# Patient Record
Sex: Female | Born: 1980 | Race: Black or African American | Hispanic: No | Marital: Single | Smoking: Never smoker
Health system: Southern US, Community
[De-identification: ages and names within clinical notes are randomized; demographics above are authoritative.]

## PROBLEM LIST (undated history)

## (undated) ENCOUNTER — Ambulatory Visit (HOSPITAL_COMMUNITY): Admission: EM | Payer: Medicaid Other

## (undated) DIAGNOSIS — I10 Essential (primary) hypertension: Secondary | ICD-10-CM

---

## 2017-05-30 ENCOUNTER — Emergency Department (HOSPITAL_COMMUNITY)
Admission: EM | Admit: 2017-05-30 | Discharge: 2017-05-30 | Disposition: A | Discharge status: Home / Self Care | Payer: No Typology Code available for payment source | Attending: Emergency Medicine | Admitting: Emergency Medicine

## 2017-05-30 ENCOUNTER — Other Ambulatory Visit: Payer: Self-pay

## 2017-05-30 ENCOUNTER — Encounter (HOSPITAL_COMMUNITY): Payer: Self-pay

## 2017-05-30 ENCOUNTER — Emergency Department (HOSPITAL_COMMUNITY): Payer: No Typology Code available for payment source

## 2017-05-30 DIAGNOSIS — S20212A Contusion of left front wall of thorax, initial encounter: Secondary | ICD-10-CM | POA: Diagnosis not present

## 2017-05-30 DIAGNOSIS — I10 Essential (primary) hypertension: Secondary | ICD-10-CM | POA: Diagnosis not present

## 2017-05-30 DIAGNOSIS — Y939 Activity, unspecified: Secondary | ICD-10-CM | POA: Insufficient documentation

## 2017-05-30 DIAGNOSIS — Y929 Unspecified place or not applicable: Secondary | ICD-10-CM | POA: Diagnosis not present

## 2017-05-30 DIAGNOSIS — S299XXA Unspecified injury of thorax, initial encounter: Secondary | ICD-10-CM | POA: Diagnosis present

## 2017-05-30 DIAGNOSIS — Y999 Unspecified external cause status: Secondary | ICD-10-CM | POA: Insufficient documentation

## 2017-05-30 HISTORY — DX: Essential (primary) hypertension: I10

## 2017-05-30 MED ORDER — KETOROLAC TROMETHAMINE 30 MG/ML IJ SOLN
30.0000 mg | Freq: Once | INTRAMUSCULAR | Status: AC
  Administered 2017-05-30: 30 mg via INTRAMUSCULAR
  Filled 2017-05-30: qty 1
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
## 2017-05-30 NOTE — Discharge Instructions (Signed)
Please return for any problem.  °

## 2017-05-30 NOTE — ED Triage Notes (Signed)
Correction: Pt reports pain to left side of neck and abdomen.
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
## 2019-11-04 ENCOUNTER — Ambulatory Visit: Payer: Medicaid Other | Attending: Internal Medicine

## 2019-11-04 DIAGNOSIS — Z23 Encounter for immunization: Secondary | ICD-10-CM

## 2019-11-04 NOTE — Progress Notes (Signed)
   Covid-19 Vaccination Clinic  Name:  Lisa Combs    MRN: [id number] DOB: [birth year]  11/04/2019  [name] was observed post Covid-19 immunization for 15 minutes without incident. She was provided with Vaccine Information Sheet and instruction to access the V-Safe system.   [name] was instructed to call 911 with any severe reactions post vaccine: Marland Kitchen Difficulty breathing  . Swelling of face and throat  . A fast heartbeat  . A bad rash all over body  . Dizziness and weakness   Immunizations Administered    Name Date Dose VIS Date Route   Pfizer COVID-19 Vaccine 11/04/2019  1:31 PM 0.3 mL 07/01/2019 Intramuscular   Manufacturer: [employer], [employer]   Lot: [id number]   NDC: [address]-9

## 2019-11-28 ENCOUNTER — Ambulatory Visit: Payer: Medicaid Other | Attending: Internal Medicine

## 2019-11-28 DIAGNOSIS — Z23 Encounter for immunization: Secondary | ICD-10-CM

## 2019-11-28 NOTE — Progress Notes (Signed)
   Covid-19 Vaccination Clinic  Name:  Lisa Combs    MRN: [id number] DOB: 1980-11-08  11/28/2019  Lisa Combs was observed post Covid-19 immunization for 15 minutes without incident. She was provided with Vaccine Information Sheet and instruction to access the V-Safe system.   Lisa Combs was instructed to call 911 with any severe reactions post vaccine: Marland Kitchen Difficulty breathing  . Swelling of face and throat  . A fast heartbeat  . A bad rash all over body  . Dizziness and weakness   Immunizations Administered    Name Date Dose VIS Date Route   Pfizer COVID-19 Vaccine 11/28/2019 12:20 PM 0.3 mL 09/14/2018 Intramuscular   Manufacturer: [employer], [employer]   Lot: [id number]   NDC: [address]-3

## 2023-08-19 ENCOUNTER — Encounter (HOSPITAL_BASED_OUTPATIENT_CLINIC_OR_DEPARTMENT_OTHER): Payer: Self-pay | Admitting: Emergency Medicine

## 2023-08-19 ENCOUNTER — Emergency Department (HOSPITAL_BASED_OUTPATIENT_CLINIC_OR_DEPARTMENT_OTHER)
Admission: EM | Admit: 2023-08-19 | Discharge: 2023-08-19 | Disposition: A | Discharge status: Home / Self Care | Payer: 59 | Attending: Emergency Medicine | Admitting: Emergency Medicine

## 2023-08-19 ENCOUNTER — Other Ambulatory Visit: Payer: Self-pay

## 2023-08-19 DIAGNOSIS — U071 COVID-19: Secondary | ICD-10-CM | POA: Diagnosis not present

## 2023-08-19 DIAGNOSIS — I10 Essential (primary) hypertension: Secondary | ICD-10-CM | POA: Diagnosis not present

## 2023-08-19 DIAGNOSIS — Z79899 Other long term (current) drug therapy: Secondary | ICD-10-CM | POA: Diagnosis not present

## 2023-08-19 DIAGNOSIS — R509 Fever, unspecified: Secondary | ICD-10-CM | POA: Diagnosis present

## 2023-08-19 LAB — RESP PANEL BY RT-PCR (RSV, FLU A&B, COVID)  RVPGX2
Influenza A by PCR: NEGATIVE
Influenza B by PCR: NEGATIVE
Resp Syncytial Virus by PCR: NEGATIVE
SARS Coronavirus 2 by RT PCR: POSITIVE — AB

## 2023-08-19 MED ORDER — AMLODIPINE BESYLATE 5 MG PO TABS: 5.0000 mg | ORAL_TABLET | Freq: Every day | ORAL | 2 refills | Status: DC

## 2023-08-19 NOTE — ED Triage Notes (Signed)
C/o cough, congestion, chills, and nausea/diarrhea  x 3 days, Denies SHOB.

## 2023-08-19 NOTE — Discharge Instructions (Signed)
You were seen in the ER today for concerns of a fever. You tested positive for COVID-19. This is a viral infection that is contagious. Given you are more than 3 days into symptoms starting, I would recommend against starting antiviral medication for this at this time. You should focus treating your symptoms at home as best as possible.  I have sent in refills of your blood pressure medication to your pharmacy while you are working to get set up with a primary care provider. You will need to be seen by a primary care provider for future refills.

## 2023-08-19 NOTE — ED Provider Notes (Signed)
[place] [facility] AT [facility] Provider Note   CSN: [id number] Arrival date & time: 08/19/23  1638     History  Chief Complaint  Patient presents with   Fever    Lisa Combs is a 43 y.o. female.  Patient past history significant for hypertension presents to the emergency department concerns of a fever, cough and congestion.  States this been ongoing for the last 3 to 4 days.  Denies any shortness of breath or chest pain.  Also endorsing some nausea and diarrhea.  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   Fever      Home Medications Prior to Admission medications   Medication Sig Start Date End Date Taking? Authorizing Provider  amLODipine (NORVASC) 5 MG tablet Take 1 tablet (5 mg total) by mouth daily. 08/19/23 09/18/23 Yes Smitty Knudsen, PA-C      Allergies    Patient has no known allergies.    Review of Systems   Review of Systems  Constitutional:  Positive for fever.  All other systems reviewed and are negative.   Physical Exam Updated Vital Signs BP (!) 155/110 (BP Location: Right Arm)   Pulse 87   Temp 98.9 F (37.2 C) (Oral)   Resp 16   SpO2 99%  Physical Exam Vitals and nursing note reviewed.  Constitutional:      General: She is not in acute distress.    Appearance: She is well-developed.  HENT:     Head: Normocephalic and atraumatic.  Eyes:     Conjunctiva/sclera: Conjunctivae normal.  Cardiovascular:     Rate and Rhythm: Normal rate and regular rhythm.     Heart sounds: No murmur heard. Pulmonary:     Effort: Pulmonary effort is normal. No respiratory distress.     Breath sounds: Normal breath sounds. No wheezing or rales.  Abdominal:     Palpations: Abdomen is soft.     Tenderness: There is no abdominal tenderness.  Musculoskeletal:        General: No  swelling.     Cervical back: Neck supple.  Skin:    General: Skin is warm and dry.     Capillary Refill: Capillary refill takes less than 2 seconds.  Neurological:     Mental Status: She is alert.  Psychiatric:        Mood and Affect: Mood normal.     ED Results / Procedures / Treatments   Labs (all labs ordered are listed, but only abnormal results are displayed) Labs Reviewed  RESP PANEL BY RT-PCR (RSV, FLU A&B, COVID)  [id number] - Abnormal; Notable for the following components:      Result Value   SARS Coronavirus 2 by RT PCR POSITIVE (*)    All other components within normal limits    EKG None  Radiology No results found.  Procedures Procedures    Medications Ordered in ED Medications - No data to display  ED Course/ Medical Decision Making/ A&P                                 Medical Decision Making Risk Prescription drug management.   This patient presents to the ED for concern of fever. Differential diagnosis includes COVID-19, influenza, pneumonia, viral URI  Lab Tests:  I Ordered, and personally interpreted labs.  The pertinent results include: Respiratory panel positive for COVID-19   Problem List / ED Course:  Patient with past history significant for hypertension presents the emergency department with concerns of a fever.  No sick contacts as far as patient is aware but she does report that she works in [employer], [employer] and there are typically individuals who are sick fairly frequently.  She currently denies any chest pain or shortness of breath.  No other acute or focal concerns.  States that she has been try to take some over-the-counter medications for her symptoms as needed.  Tolerating oral intake without any notable vomiting. Physical exam is unremarkable with no abnormal lung sounds.  No development of any heart murmur.  Vitals are unremarkable with exception of mild hypertension.  Patient reports that she takes amlodipine for this and is needing a  refill as she has not been able to set up with a primary care provider here locally to continue with her medications.  She states that she is try to reach out to a virtual service who only refills her medications 2 weeks at a time.  Given that patient is able to improve her home medication, I have sent in a refill of her medication and advised her to set up with a primary care provider for further refills.  No indication to suggest that patient is currently experiencing hypertensive emergency as she is not having chest pain, shortness of breath, vision changes, or notable leg swelling. At this time, I believe that patient is stable for outpatient follow-up and discharge home given that she is hemodynamically stable.  Encouraged symptomatic treatment for COVID-19 infection.  Also discussed return precautions and precautions to take at home with vulnerable individuals.  Patient verbalized understanding and agreed with this current plan.  Discharged home in stable condition.   Social Determinants of Health:  No current primary care provider  Final Clinical Impression(s) / ED Diagnoses Final diagnoses:  COVID-19  Hypertension, unspecified type    Rx / DC Orders ED Discharge Orders          Ordered    amLODipine (NORVASC) 5 MG tablet  Daily        08/19/23 1825              Smitty Knudsen, PA-C 08/19/23 1833    Royanne Foots, DO 08/24/23 [phone]

## 2024-01-02 ENCOUNTER — Telehealth: Admitting: Family

## 2024-01-02 DIAGNOSIS — R399 Unspecified symptoms and signs involving the genitourinary system: Secondary | ICD-10-CM | POA: Diagnosis not present

## 2024-01-02 DIAGNOSIS — R5383 Other fatigue: Secondary | ICD-10-CM

## 2024-01-02 NOTE — Patient Instructions (Signed)

## 2024-01-02 NOTE — Progress Notes (Signed)
 Virtual Visit Consent   Lisa Combs, you are scheduled for a virtual visit with a [facility] provider today. Just as with appointments in the office, your consent must be obtained to participate. Your consent will be active for this visit and any virtual visit you may have with one of our providers in the next 365 days. If you have a MyChart account, a copy of this consent can be sent to you electronically.  As this is a virtual visit, video technology does not allow for your provider to perform a traditional examination. This may limit your provider's ability to fully assess your condition. If your provider identifies any concerns that need to be evaluated in person or the need to arrange testing (such as labs, EKG, etc.), we will make arrangements to do so. Although advances in technology are sophisticated, we cannot ensure that it will always work on either your end or our end. If the connection with a video visit is poor, the visit may have to be switched to a telephone visit. With either a video or telephone visit, we are not always able to ensure that we have a secure connection.  By engaging in this virtual visit, you consent to the provision of healthcare and authorize for your insurance to be billed (if applicable) for the services provided during this visit. Depending on your insurance coverage, you may receive a charge related to this service.  I need to obtain your verbal consent now. Are you willing to proceed with your visit today? Jaleia Hanke has provided verbal consent on 01/02/2024 for a virtual visit (video or telephone). Tommas Fragmin, FNP  Date: 01/02/2024 7:19 PM   Virtual Visit via Video Note   I, Tommas Fragmin, connected with  Lisa Combs  ([id number], 1981-05-30) on 01/02/24 at  7:15 PM EDT by a video-enabled telemedicine application and verified that I am speaking with the correct person using two identifiers.  Location: Patient: Virtual Visit Location  Patient: Home Provider: Virtual Visit Location Provider: Home Office   I discussed the limitations of evaluation and management by telemedicine and the availability of in person appointments. The patient expressed understanding and agreed to proceed.    History of Present Illness: Lisa Combs is a 43 y.o. who identifies as a female who was assigned female at birth, and is being seen today for UTI symptoms that started two days ago. She was seen at Urgent Care and started on macrobid. She reports she continues to have dysuria, urinary frequency, and abdominal pain. Reports she is feeling better since starting the macrobid, but is having fatigue.   HPI: Urinary Frequency  This is a new problem. The current episode started in the past 7 days. The problem occurs intermittently. The quality of the pain is described as burning. The pain is at a severity of 7/10. The pain is mild. Associated symptoms include frequency, hesitancy, nausea and urgency. Pertinent negatives include no flank pain, hematuria or vomiting. She has tried antibiotics and increased fluids for the symptoms. The treatment provided mild relief.    Problems: There are no active problems to display for this patient.   Allergies: No Known Allergies Medications:  Current Outpatient Medications:    amLODipine  (NORVASC ) 5 MG tablet, Take 1 tablet (5 mg total) by mouth daily., Disp: 30 tablet, Rfl: 2  Observations/Objective: Patient is well-developed, well-nourished in no acute distress.  Resting comfortably  at home.  Head is normocephalic, atraumatic.  No labored breathing.  Speech is clear and coherent  with logical content.  Patient is alert and oriented at baseline.    Assessment and Plan: 1. UTI symptoms (Primary)  2. Other fatigue  Continue Macrobid Force fluids AZO as needed Work note given  Follow up if symptoms worsen or do not improve   Follow Up Instructions: I discussed the assessment and treatment plan  with the patient. The patient was provided an opportunity to ask questions and all were answered. The patient agreed with the plan and demonstrated an understanding of the instructions.  A copy of instructions were sent to the patient via MyChart unless otherwise noted below.     The patient was advised to call back or seek an in-person evaluation if the symptoms worsen or if the condition fails to improve as anticipated.    Tommas Fragmin, FNP

## 2024-01-26 ENCOUNTER — Emergency Department (HOSPITAL_COMMUNITY)
Admission: EM | Admit: 2024-01-26 | Discharge: 2024-01-27 | Disposition: A | Discharge status: Home / Self Care | Attending: Emergency Medicine | Admitting: Emergency Medicine

## 2024-01-26 ENCOUNTER — Encounter (HOSPITAL_COMMUNITY): Payer: Self-pay

## 2024-01-26 ENCOUNTER — Other Ambulatory Visit: Payer: Self-pay

## 2024-01-26 DIAGNOSIS — Z79899 Other long term (current) drug therapy: Secondary | ICD-10-CM | POA: Diagnosis not present

## 2024-01-26 DIAGNOSIS — R600 Localized edema: Secondary | ICD-10-CM | POA: Insufficient documentation

## 2024-01-26 DIAGNOSIS — I10 Essential (primary) hypertension: Secondary | ICD-10-CM | POA: Diagnosis present

## 2024-01-26 LAB — CBC
HCT: 41 % (ref 36.0–46.0)
Hemoglobin: 13.7 g/dL (ref 12.0–15.0)
MCH: 30.6 pg (ref 26.0–34.0)
MCHC: 33.4 g/dL (ref 30.0–36.0)
MCV: 91.7 fL (ref 80.0–100.0)
Platelets: 314 K/uL (ref 150–400)
RBC: 4.47 MIL/uL (ref 3.87–5.11)
RDW: 12.5 % (ref 11.5–15.5)
WBC: 7.7 K/uL (ref 4.0–10.5)
nRBC: 0 % (ref 0.0–0.2)

## 2024-01-26 LAB — BASIC METABOLIC PANEL WITH GFR
Anion gap: 11 (ref 5–15)
BUN: 11 mg/dL (ref 6–20)
CO2: 24 mmol/L (ref 22–32)
Calcium: 9 mg/dL (ref 8.9–10.3)
Chloride: 103 mmol/L (ref 98–111)
Creatinine, Ser: 0.57 mg/dL (ref 0.44–1.00)
GFR, Estimated: >60 mL/min (ref >60)
Glucose, Bld: 105 mg/dL — ABNORMAL HIGH (ref 70–99)
Potassium: 3.7 mmol/L (ref 3.5–5.1)
Sodium: 138 mmol/L (ref 135–145)

## 2024-01-26 LAB — TROPONIN I (HIGH SENSITIVITY): Troponin I (High Sensitivity): 3 ng/L (ref <18)

## 2024-01-26 LAB — HCG, SERUM, QUALITATIVE: Preg, Serum: NEGATIVE

## 2024-01-26 NOTE — ED Triage Notes (Signed)
 Complaining of high blood pressure that started a couple of days ago. Has been taking her medication. She said that starting yesterday she has felt flushed, like her arms are heavy, and dizzy.

## 2024-01-27 LAB — TROPONIN I (HIGH SENSITIVITY): Troponin I (High Sensitivity): 3 ng/L (ref <18)

## 2024-01-27 MED ORDER — AMLODIPINE BESYLATE 5 MG PO TABS
10.0000 mg | ORAL_TABLET | Freq: Once | ORAL | Status: AC
  Administered 2024-01-27: 10 mg via ORAL
  Filled 2024-01-27: qty 2

## 2024-01-27 MED ORDER — AMLODIPINE BESYLATE 10 MG PO TABS: 10.0000 mg | ORAL_TABLET | Freq: Every day | ORAL | 3 refills | Status: AC

## 2024-01-27 NOTE — ED Provider Notes (Signed)
 [place] EMERGENCY DEPARTMENT AT [facility] Provider Note   CSN: [id number] Arrival date & time: 01/26/24  2040     Patient presents with: Hypertension   Lisa Combs is a 43 y.o. female.   Patient with past medical history of hypertension presents to emergency room with complaint of high blood pressure.  Denies headache, chest pain or shortness of breath.  Did have a very short episode of bilateral hand tingling today while at work but denies any weakness.  Denies slurred speech or vision change.  Denies current headache or current symptoms.  Notes that she is out of her blood pressure medicine and needs a refill.  Typically takes 5 mg of amlodipine  but notes blood pressures have been 160/110 at home.     Hypertension Pertinent negatives include no chest pain.       Prior to Admission medications   Medication Sig Start Date End Date Taking? Authorizing Provider  amLODipine  (NORVASC ) 10 MG tablet Take 1 tablet (10 mg total) by mouth daily. 01/27/24  Yes Nealie Mchatton N, PA-C    Allergies: Patient has no known allergies.    Review of Systems  Constitutional:  Positive for activity change.  Cardiovascular:  Negative for chest pain.    Updated Vital Signs BP (!) 153/96   Pulse 85   Temp 98.3 F (36.8 C) (Oral)   Resp 14   Ht 5' 4 (1.626 m)   Wt 91.2 kg   LMP 01/22/2024   SpO2 96%   BMI 34.50 kg/m   Physical Exam Vitals and nursing note reviewed.  Constitutional:      General: She is not in acute distress.    Appearance: She is not toxic-appearing.  HENT:     Head: Normocephalic and atraumatic.  Eyes:     General: No scleral icterus.    Conjunctiva/sclera: Conjunctivae normal.  Cardiovascular:     Rate and Rhythm: Normal rate and regular rhythm.     Pulses: Normal pulses.     Heart sounds: Normal heart sounds.  Pulmonary:     Effort: Pulmonary effort is normal. No respiratory distress.     Breath sounds: Normal breath sounds.  Abdominal:      General: Abdomen is flat. Bowel sounds are normal.     Palpations: Abdomen is soft.     Tenderness: There is no abdominal tenderness.  Musculoskeletal:     Right lower leg: Edema present.     Left lower leg: Edema present.     Comments: Very mild nonpitting edema bilaterally.  No calf tenderness.  No sign of infection.  Skin:    General: Skin is warm and dry.     Findings: No lesion.  Neurological:     General: No focal deficit present.     Mental Status: She is alert and oriented to person, place, and time. Mental status is at baseline.     Comments: Patient is alert and oriented.  No slurred speech.  Answering questions appropriately. No cranial nerve deficits.  Pupils equal and reactive.  No nystagmus.  No ataxia with finger-to-nose.  Upper extremity sensation and strength intact bilaterally.  Lower extremity sensation and strength intact bilaterally.     (all labs ordered are listed, but only abnormal results are displayed) Labs Reviewed  BASIC METABOLIC PANEL WITH GFR - Abnormal; Notable for the following components:      Result Value   Glucose, Bld 105 (*)    All other components within normal limits  CBC  HCG, SERUM, QUALITATIVE  TROPONIN I (HIGH SENSITIVITY)  TROPONIN I (HIGH SENSITIVITY)    EKG: None  Radiology: No results found.   Procedures   Medications Ordered in the ED  amLODipine  (NORVASC ) tablet 10 mg (has no administration in time range)                                    Medical Decision Making Amount and/or Complexity of Data Reviewed Labs: ordered.  Risk Prescription drug management.   This patient presents to the ED for concern of evaded blood pressure, this involves an extensive number of treatment options, and is a complaint that carries with it a high risk of complications and morbidity.  The differential diagnosis includes CVA, ACS, hypertension, hypertensive emergency   Co morbidities that complicate the patient  evaluation  Hypertension    Lab Tests:  I personally interpreted labs.  The pertinent results include:   CBC without leukocytosis or anemia.  BMP without electrolyte abnormality.  Normal kidney function.  Troponin 3.   Imaging Studies ordered:  Considered however patient denies any symptoms thus do not feel needs ordered today.   Cardiac Monitoring: / EKG:  The patient was maintained on a cardiac monitor.  I personally viewed and interpreted the cardiac monitored which showed an underlying rhythm of: sinus    Problem List / ED Course / Critical interventions / Medication management  Patient presents emergency room with complaint of hypertension.  On my evaluation she has no symptoms.  She does not have symptoms consistent with CVA or TIA.  She has normal neurological exam.  She denies any chest pain or shortness of breath.  She has no abdominal pain or nausea.  Labs without any acute abnormality.  She reports she sometimes has headaches but no headache today.  Given no pain and no symptoms do not feel imaging is to be pursued at this time.  Do not feel exam is consistent with hypertensive emergency.  Given home dose of medication.  Sent with refills.  Given return precautions.  Encouraged follow-up with primary care. Patient was given her home dose of amlodipine  as she has not taken it today. I have reviewed the patients home medicines and have made adjustments as needed   Plan  F/u w/ PCP in 2-3d to ensure resolution of sx.  Patient was given return precautions. Patient stable for discharge at this time.  Patient educated on sx/dx and verbalized understanding of plan. Return to ER w/ new or worsening sx.       Final diagnoses:  Hypertension, unspecified type    ED Discharge Orders          Ordered    amLODipine  (NORVASC ) 10 MG tablet  Daily        01/27/24 0127               Smt Lokey, Warren SAILOR, PA-C 01/27/24 0157    Griselda Norris, MD 01/27/24 [phone]

## 2024-01-27 NOTE — Discharge Instructions (Addendum)
 Find a primary care doctor.  Your lab work is overall reassuring.  I have sent refill for your blood pressure medicine.  I will have you start 10 mg.  You can transition back to 5 mg if this is lower your blood pressure too much.  Return to ER with worsening symptoms.
